# Patient Record
Sex: Male | Born: 1945 | Race: White | Hispanic: No
Health system: Southern US, Community
[De-identification: ages and names within clinical notes are randomized; demographics above are authoritative.]

---

## 2003-05-11 ENCOUNTER — Ambulatory Visit (HOSPITAL_COMMUNITY): Admission: RE | Admit: 2003-05-11 | Discharge: 2003-05-11 | Payer: Self-pay | Admitting: Neurosurgery

## 2003-06-07 ENCOUNTER — Inpatient Hospital Stay (HOSPITAL_COMMUNITY): Admission: RE | Admit: 2003-06-07 | Discharge: 2003-06-09 | Payer: Self-pay | Admitting: Neurosurgery

## 2018-02-28 ENCOUNTER — Other Ambulatory Visit: Payer: Self-pay | Admitting: Student

## 2018-02-28 DIAGNOSIS — M5416 Radiculopathy, lumbar region: Secondary | ICD-10-CM

## 2018-03-09 ENCOUNTER — Ambulatory Visit
Admission: RE | Admit: 2018-03-09 | Discharge: 2018-03-09 | Disposition: A | Discharge status: Home / Self Care | Payer: Medicare Other | Source: Ambulatory Visit | Attending: Student | Admitting: Student

## 2018-03-09 DIAGNOSIS — M5416 Radiculopathy, lumbar region: Secondary | ICD-10-CM

## 2018-04-11 ENCOUNTER — Telehealth: Payer: Self-pay | Admitting: Nurse Practitioner

## 2018-04-11 ENCOUNTER — Other Ambulatory Visit: Payer: Self-pay | Admitting: Neurosurgery

## 2018-04-11 DIAGNOSIS — M4807 Spinal stenosis, lumbosacral region: Secondary | ICD-10-CM

## 2018-04-11 DIAGNOSIS — M9983 Other biomechanical lesions of lumbar region: Principal | ICD-10-CM

## 2018-04-11 NOTE — Telephone Encounter (Signed)
Phone call to patient to verify medication list and allergies for myelogram procedure. Pt instructed to hold Cymbalta for 48hrs prior to myelogram appointment time. Pt verbalized understanding. 

## 2018-04-19 ENCOUNTER — Ambulatory Visit
Admission: RE | Admit: 2018-04-19 | Discharge: 2018-04-19 | Disposition: A | Discharge status: Home / Self Care | Payer: Medicare Other | Source: Ambulatory Visit | Attending: Neurosurgery | Admitting: Neurosurgery

## 2018-04-19 DIAGNOSIS — M9983 Other biomechanical lesions of lumbar region: Principal | ICD-10-CM

## 2018-04-19 DIAGNOSIS — M4807 Spinal stenosis, lumbosacral region: Secondary | ICD-10-CM

## 2018-04-19 MED ORDER — ONDANSETRON HCL 4 MG/2ML IJ SOLN: 4.0000 mg | Freq: Four times a day (QID) | INTRAMUSCULAR | Status: DC | PRN

## 2018-04-19 MED ORDER — IOPAMIDOL (ISOVUE-M 200) INJECTION 41%
15.0000 mL | Freq: Once | INTRAMUSCULAR | Status: AC
  Administered 2018-04-19: 15 mL via INTRATHECAL

## 2018-04-19 MED ORDER — DIAZEPAM 5 MG PO TABS
5.0000 mg | ORAL_TABLET | Freq: Once | ORAL | Status: AC
  Administered 2018-04-19: 5 mg via ORAL

## 2018-04-19 NOTE — Progress Notes (Signed)
Pt reports he has been off of Cymbalta for at least 48hrs for his myelogram procedure today.

## 2018-04-19 NOTE — Discharge Instructions (Signed)
Myelogram Discharge Instructions  1. Go home and rest quietly for the next 24 hours.  It is important to lie flat for the next 24 hours.  Get up only to go to the restroom.  You may lie in the bed or on a couch on your back, your stomach, your left side or your right side.  You may have one pillow under your head.  You may have pillows between your knees while you are on your side or under your knees while you are on your back.  2. DO NOT drive today.  Recline the seat as far back as it will go, while still wearing your seat belt, on the way home.  3. You may get up to go to the bathroom as needed.  You may sit up for 10 minutes to eat.  You may resume your normal diet and medications unless otherwise indicated.  Drink lots of extra fluids today and tomorrow.  4. The incidence of headache, nausea, or vomiting is about 5% (one in 20 patients).  If you develop a headache, lie flat and drink plenty of fluids until the headache goes away.  Caffeinated beverages may be helpful.  If you develop severe nausea and vomiting or a headache that does not go away with flat bed rest, call [phone].  5. You may resume normal activities after your 24 hours of bed rest is over; however, do not exert yourself strongly or do any heavy lifting tomorrow. If when you get up you have a headache when standing, go back to bed and force fluids for another 24 hours.  6. Call your physician for a follow-up appointment.  The results of your myelogram will be sent directly to your physician by the following day.  7. If you have any questions or if complications develop after you arrive home, please call [phone].  Discharge instructions have been explained to the patient.  The patient, or the person responsible for the patient, fully understands these instructions.  YOU MAY RESTART YOUR CYMBALTA TOMORROW 04/20/2018 AT 1:00PM.

## 2019-07-31 IMAGING — MR MR LUMBAR SPINE W/O CM
5 series · 31 of 48 positions shown · non-contrast
Comparison: CT myelogram 05/11/2003

CLINICAL DATA: Lumbar radiculopathy.  Lumbar fusion.

EXAM:
MRI LUMBAR SPINE WITHOUT CONTRAST
TECHNIQUE: Multiplanar, multisequence MR imaging of the lumbar spine was
performed. No intravenous contrast was administered.

[Series 3: T2 · sagittal · 4.0mm · 0.49mm/px · 6 of 12 slices shown (1 of 2)]
[im 1/12]
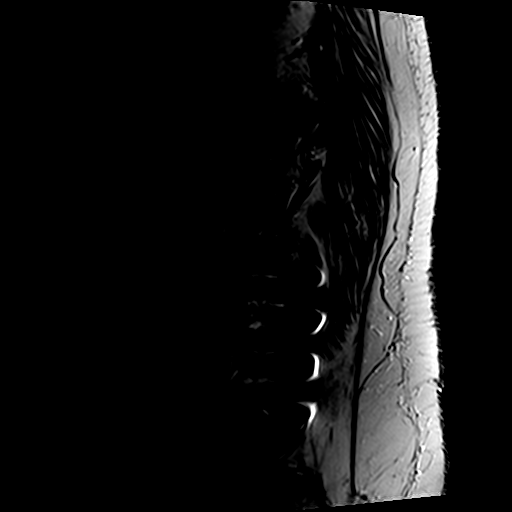
[im 3/12]
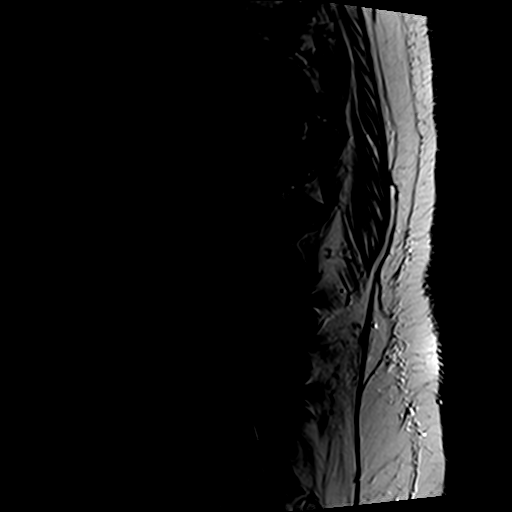
[im 5/12]
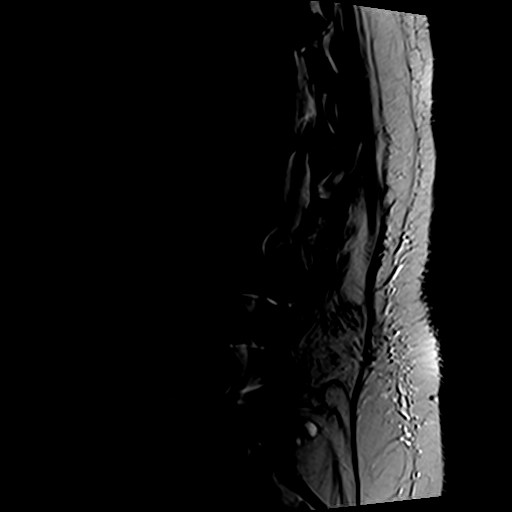
[im 7/12]
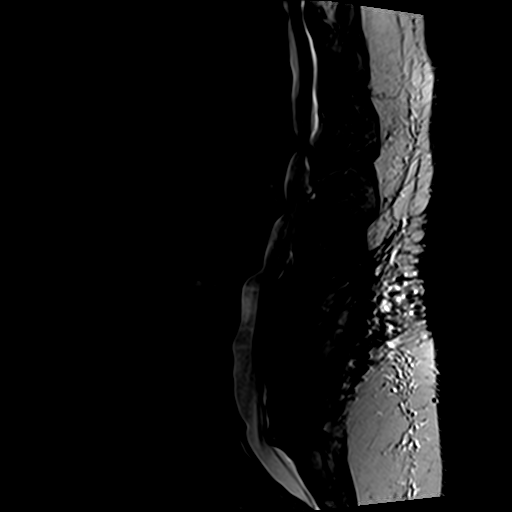
[im 9/12]
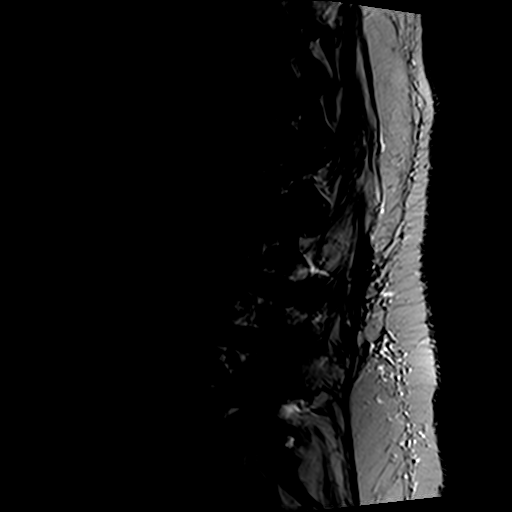
[im 12/12]
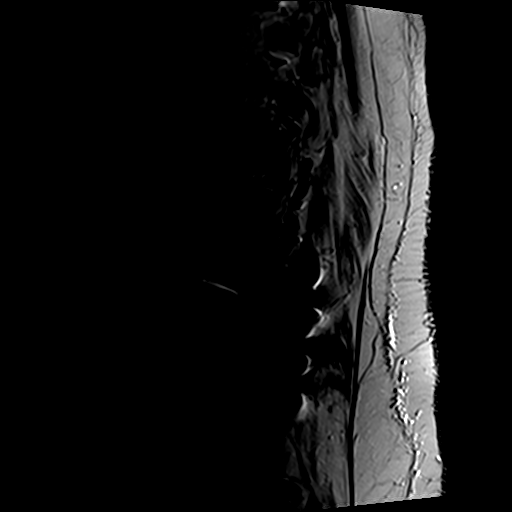

[Series 4: T1 · sagittal · 4.0mm · 0.49mm/px · 6 of 12 slices shown (1 of 2)]
[im 1/12]
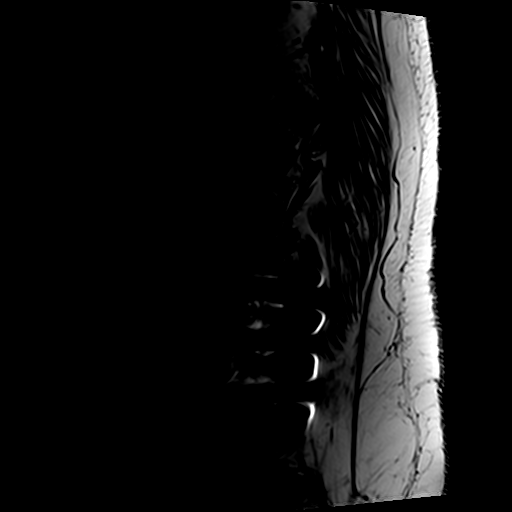
[im 3/12]
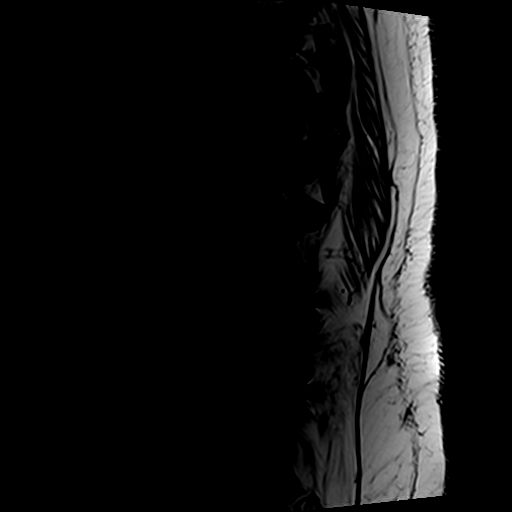
[im 5/12]
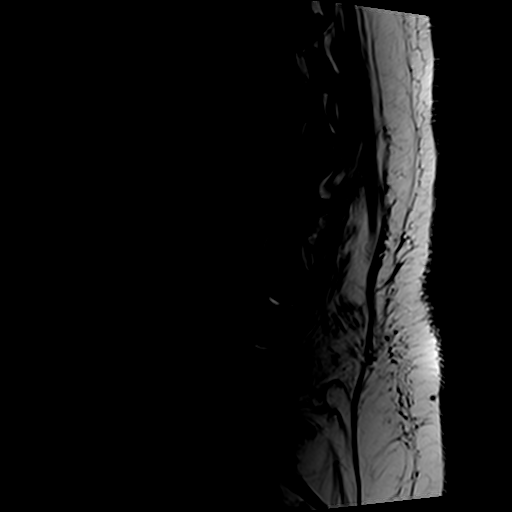
[im 7/12]
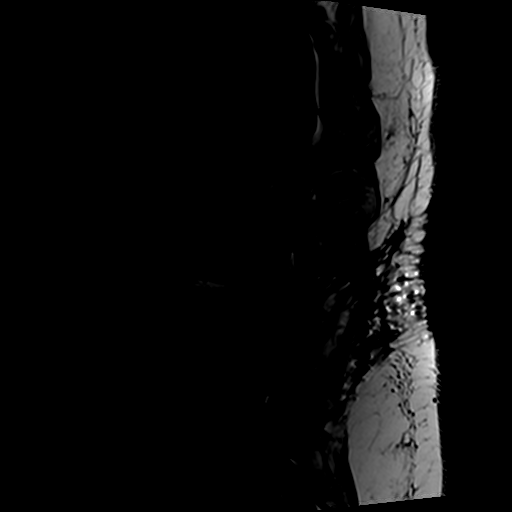
[im 9/12]
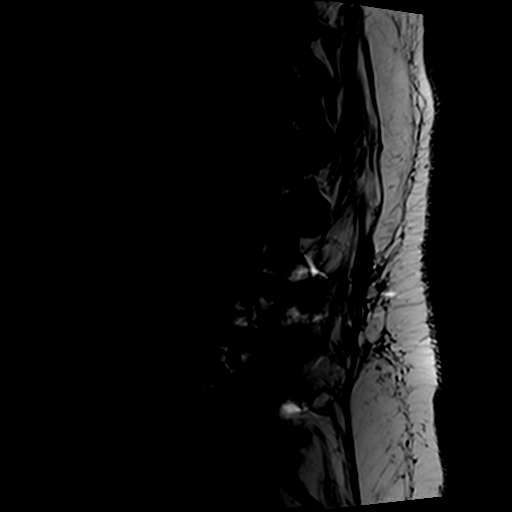
[im 12/12]
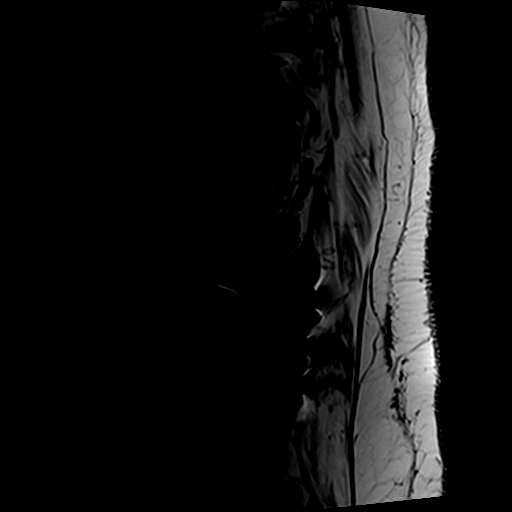

[Series 5: STIR · sagittal · 4.0mm · 0.49mm/px · 1 of 12 slices shown]
[im 1/12]
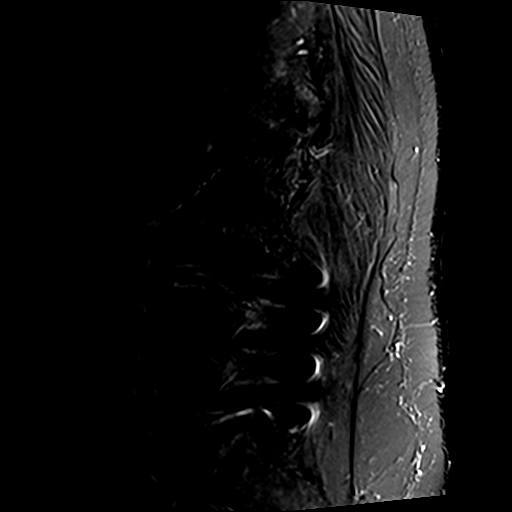

[Series 6: T2 · axial · 4.0mm · 0.70mm/px · z∈[-92,+95]mm · 9 of 32 slices shown (2 of 2)]
[im 1/32]
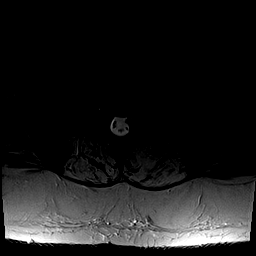
[im 5/32]
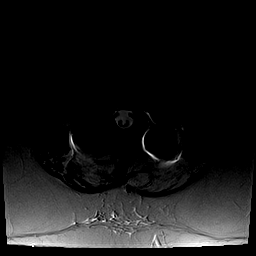
[im 9/32]
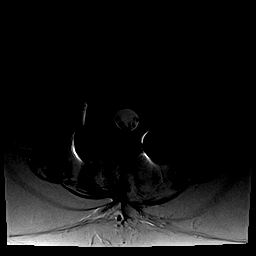
[im 14/32]
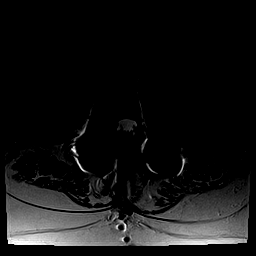
[im 16/32]
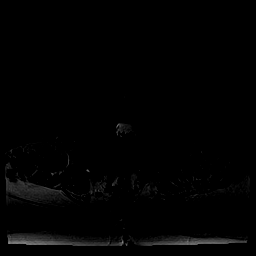
[im 18/32]
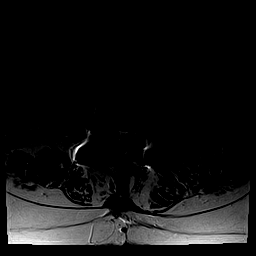
[im 23/32]
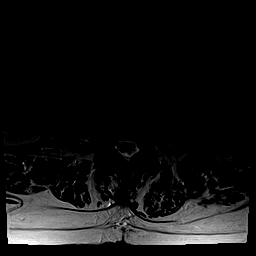
[im 27/32]
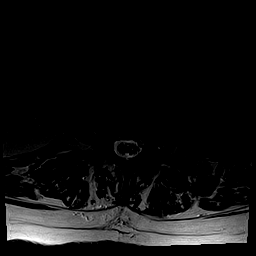
[im 32/32]
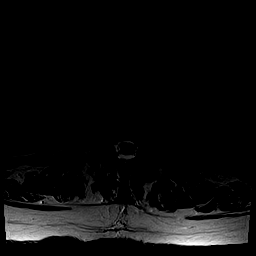

[Series 7: T1 · axial · 4.0mm · 0.70mm/px · z∈[-92,+95]mm · 9 of 32 slices shown (2 of 2)]
[im 1/32]
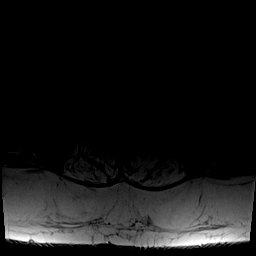
[im 5/32]
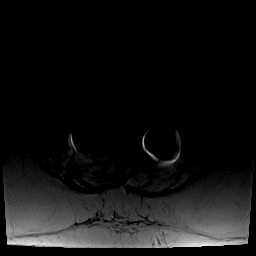
[im 9/32]
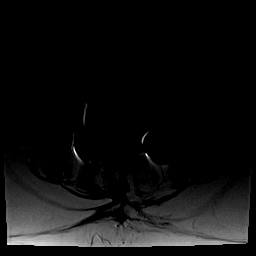
[im 14/32]
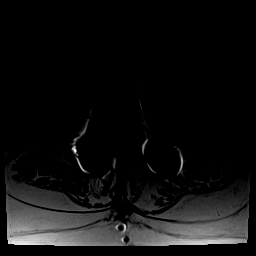
[im 16/32]
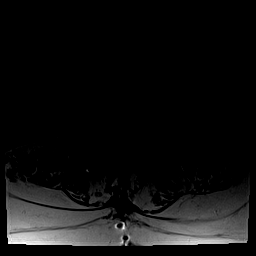
[im 18/32]
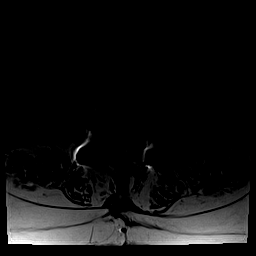
[im 23/32]
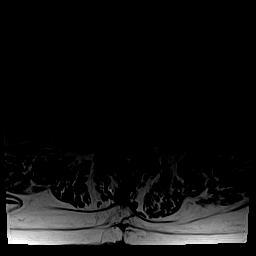
[im 27/32]
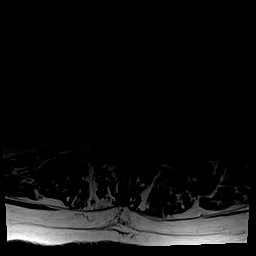
[im 32/32]
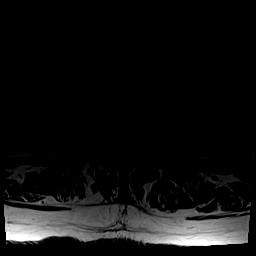

[31 of 48 positions shown; findings below may reference images not displayed]

FINDINGS: Segmentation:  Normal

Alignment: 3 mm retrolisthesis L1-2. 5 mm retrolisthesis L2-3 with
progression from the prior study.

Vertebrae: Pedicle screw and interbody fusion L3-4 and L4-5.
Negative for fracture or mass. Discogenic sclerosis and edema at
T12-L1

Conus medullaris and cauda equina: Conus extends to the L1-2 level.
Conus and cauda equina appear normal.

Paraspinal and other soft tissues: Negative for paraspinous mass or
fluid collection

Disc levels:

T12-L1 asymmetric disc degeneration on the right. Central disc
protrusion and endplate spurring on the right. Bilateral facet
hypertrophy. Moderate spinal stenosis. Moderate subarticular
stenosis right greater than left.

L1-2: 3 mm retrolisthesis with advanced disc degeneration. Moderate
subarticular stenosis bilaterally. Spinal canal adequate in size

L2-3: 5 mm retrolisthesis with advanced disc degeneration. Bilateral
facet degeneration. Mild spinal stenosis. Moderate subarticular and
foraminal stenosis bilaterally due to spurring

L3-4: PLIF with solid fusion.  Negative for stenosis

L4-5: PLIF.  Probable solid fusion.  Negative for stenosis

L5-S1: Advanced disc degeneration and spurring. Advanced facet
degeneration bilaterally. Severe subarticular and foraminal stenosis
bilaterally due to spurring. 5 mm synovial cyst on the left
projecting into the canal. Synovial cyst projects posterior to both
facet joints.
IMPRESSION: Advanced multilevel degenerative change throughout the lumbar spine.
Multilevel stenosis as described above

PLIF L4-5 and L3-4

Severe subarticular and foraminal stenosis bilaterally at L5-S1 due
to spurring.

## 2020-09-23 DEATH — deceased
